# Patient Record
Sex: Female | Born: 1965 | Race: Black or African American | Hispanic: No | Marital: Married | Smoking: Never smoker
Health system: Southern US, Community
[De-identification: ages and names within clinical notes are randomized; demographics above are authoritative.]

---

## 2016-09-13 ENCOUNTER — Other Ambulatory Visit: Payer: Self-pay | Admitting: Internal Medicine

## 2016-09-13 DIAGNOSIS — Z1231 Encounter for screening mammogram for malignant neoplasm of breast: Secondary | ICD-10-CM

## 2016-09-15 ENCOUNTER — Ambulatory Visit
Admission: RE | Admit: 2016-09-15 | Discharge: 2016-09-15 | Disposition: A | Discharge status: Home / Self Care | Payer: BC Managed Care – PPO | Source: Ambulatory Visit | Attending: Internal Medicine | Admitting: Internal Medicine

## 2016-09-15 DIAGNOSIS — Z1231 Encounter for screening mammogram for malignant neoplasm of breast: Secondary | ICD-10-CM | POA: Diagnosis not present

## 2016-09-15 DIAGNOSIS — R928 Other abnormal and inconclusive findings on diagnostic imaging of breast: Secondary | ICD-10-CM | POA: Insufficient documentation

## 2016-09-19 ENCOUNTER — Other Ambulatory Visit: Payer: Self-pay | Admitting: Internal Medicine

## 2016-09-19 DIAGNOSIS — N6320 Unspecified lump in the left breast, unspecified quadrant: Secondary | ICD-10-CM

## 2016-09-19 DIAGNOSIS — N6310 Unspecified lump in the right breast, unspecified quadrant: Secondary | ICD-10-CM

## 2016-09-19 DIAGNOSIS — R928 Other abnormal and inconclusive findings on diagnostic imaging of breast: Secondary | ICD-10-CM

## 2016-09-29 ENCOUNTER — Ambulatory Visit
Admission: RE | Admit: 2016-09-29 | Discharge: 2016-09-29 | Disposition: A | Discharge status: Home / Self Care | Payer: BC Managed Care – PPO | Source: Ambulatory Visit | Attending: Internal Medicine | Admitting: Internal Medicine

## 2016-09-29 DIAGNOSIS — N6310 Unspecified lump in the right breast, unspecified quadrant: Secondary | ICD-10-CM

## 2016-09-29 DIAGNOSIS — N6001 Solitary cyst of right breast: Secondary | ICD-10-CM | POA: Diagnosis not present

## 2016-09-29 DIAGNOSIS — R928 Other abnormal and inconclusive findings on diagnostic imaging of breast: Secondary | ICD-10-CM

## 2016-09-29 DIAGNOSIS — N6320 Unspecified lump in the left breast, unspecified quadrant: Secondary | ICD-10-CM | POA: Diagnosis not present

## 2016-10-22 ENCOUNTER — Encounter (HOSPITAL_COMMUNITY): Payer: Self-pay | Admitting: Emergency Medicine

## 2016-10-22 ENCOUNTER — Emergency Department (HOSPITAL_COMMUNITY)
Admission: EM | Admit: 2016-10-22 | Discharge: 2016-10-22 | Disposition: A | Discharge status: Home / Self Care | Payer: BC Managed Care – PPO | Attending: Emergency Medicine | Admitting: Emergency Medicine

## 2016-10-22 DIAGNOSIS — L539 Erythematous condition, unspecified: Secondary | ICD-10-CM | POA: Diagnosis present

## 2016-10-22 DIAGNOSIS — T63441A Toxic effect of venom of bees, accidental (unintentional), initial encounter: Secondary | ICD-10-CM

## 2016-10-22 NOTE — Discharge Instructions (Signed)
Contact a health care provider if: °Your symptoms do not get better in 2-3 days. °You have redness, swelling, or pain that spreads beyond the area of the sting. °You have a fever. °Get help right away if: °You have symptoms of a severe allergic reaction. These include: °Wheezing or difficulty breathing. °Tightness in the chest or chest pain. °Light-headedness or fainting. °Itchy, raised, red patches on the skin. °Nausea or vomiting. °Abdominal cramping. °Diarrhea. °A swollen tongue or lips, or trouble swallowing. °Dizziness or fainting. °

## 2016-10-22 NOTE — ED Triage Notes (Signed)
Pt. Stated, I got stung and I think the stinger is still there. It burns.  No allergic except localized on the left side of neck.

## 2016-10-22 NOTE — ED Notes (Signed)
Declined W/C at D/C and was escorted to lobby by RN. 

## 2016-10-22 NOTE — ED Provider Notes (Signed)
[facility] Provider Note   CSN: [id number] Arrival date & time: 10/22/16  1314     History   Chief Complaint Chief Complaint  Patient presents with  . Insect Bite    HPI Tara Harvey is a 51 y.o. female who presents emergency Department with chief complaint of B stating to her neck. It occurred just prior to arrival. She's been waiting for about 2 and half hours. She denies any lip or tongue swelling, difficulty swallowing, itching in her throat, wheezing, coughing, nausea, vomiting. She has no hives or rash. She has applied ice to the area. She states that it burns but feels better than it did initially.  HPI  History reviewed. No pertinent past medical history.  There are no active problems to display for this patient.   History reviewed. No pertinent surgical history.  OB History    No data available       Home Medications    Prior to Admission medications   Not on File    Family History Family History  Problem Relation Age of Onset  . Breast cancer Neg Hx     Social History Social History  Substance Use Topics  . Smoking status: Never Smoker  . Smokeless tobacco: Never Used  . Alcohol use No     Allergies   Patient has no allergy information on record.   Review of Systems Review of Systems  HENT: Negative for trouble swallowing and voice change.   Respiratory: Negative for wheezing.   Gastrointestinal: Negative for nausea and vomiting.  Skin: Negative for rash.     Physical Exam Updated Vital Signs BP 122/60 (BP Location: Right Arm)   Pulse 82   Temp 98.3 F (36.8 C) (Oral)   Resp 17   Ht  (1.676 m)   Wt 97.5 kg (215 lb)   SpO2 99%   BMI 34.70 kg/m   Physical Exam  Constitutional: She is oriented to person, place, and time. She appears well-developed and well-nourished. No distress.  HENT:  Head: Normocephalic and atraumatic.  Eyes: Conjunctivae are normal. No scleral icterus.  Neck: Normal range of motion.    Cardiovascular: Normal rate, regular rhythm and normal heart sounds.  Exam reveals no gallop and no friction rub.   No murmur heard. Pulmonary/Chest: Effort normal and breath sounds normal. No respiratory distress. She has no wheezes.  Abdominal: Soft. Bowel sounds are normal. She exhibits no distension and no mass. There is no tenderness. There is no guarding.  Neurological: She is alert and oriented to person, place, and time.  Skin: Skin is warm and dry. She is not diaphoretic.  Erythema on the left side of the neck. No signs of  infection  Psychiatric: Her behavior is normal.  Nursing note and vitals reviewed.    ED Treatments / Results  Labs (all labs ordered are listed, but only abnormal results are displayed) Labs Reviewed - No data to display  EKG  EKG Interpretation None       Radiology No results found.  Procedures Procedures (including critical care time)  Medications Ordered in ED Medications - No data to display   Initial Impression / Assessment and Plan / ED Course  I have reviewed the triage vital signs and the nursing notes.  Pertinent labs & imaging results that were available during my care of the patient were reviewed by me and considered in my medical decision making (see chart for details).    Patient with bee sting, no  signs of allergic reaction or anaphylaxis. Discussed supportive care at home. She presents appropriate for discharge at this time. Discussed return precautions.  Final Clinical Impressions(s) / ED Diagnoses   Final diagnoses:  Bee sting, accidental or unintentional, initial encounter    New Prescriptions New Prescriptions   No medications on file     Arthor Captain, PA-C 10/22/16 1622    Charlynne Pander, MD 10/22/16 [phone]

## 2017-11-09 IMAGING — US US BREAST*L* LIMITED INC AXILLA
1 series · 12 of 12 positions shown · non-contrast
Comparison: Previous exam(s).

CLINICAL DATA: Callback from baseline screening mammogram for
possible bilateral masses

EXAM:
2D DIGITAL DIAGNOSTIC BILATERAL MAMMOGRAM WITH CAD AND ADJUNCT TOMO
ULTRASOUND BILATERAL BREAST

[Series 1: us breast*left* limited inc axilla · 0.06mm/px · 12 of 12 slices shown]
[im 1/12]
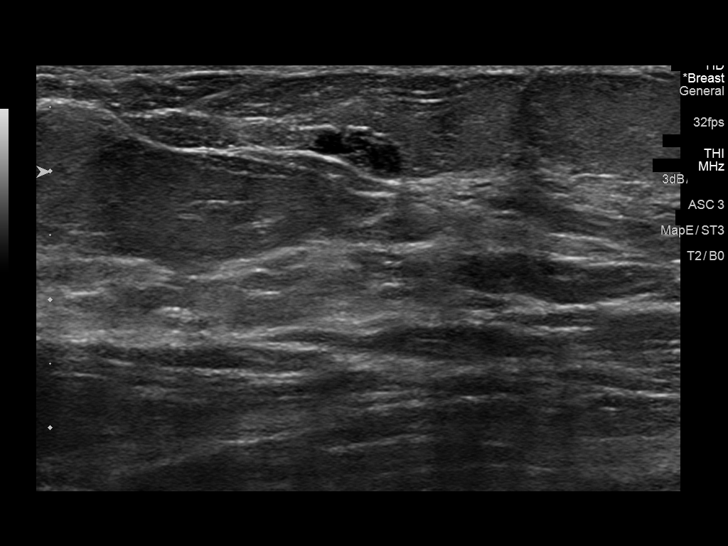
[im 2/12]
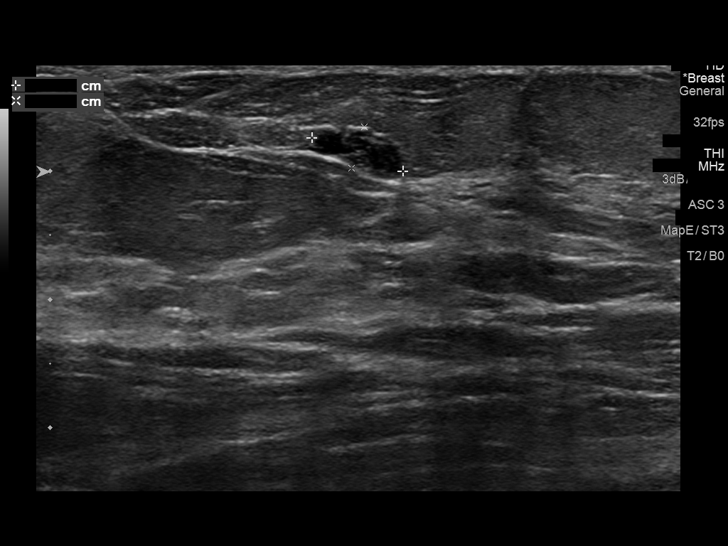
[im 3/12]
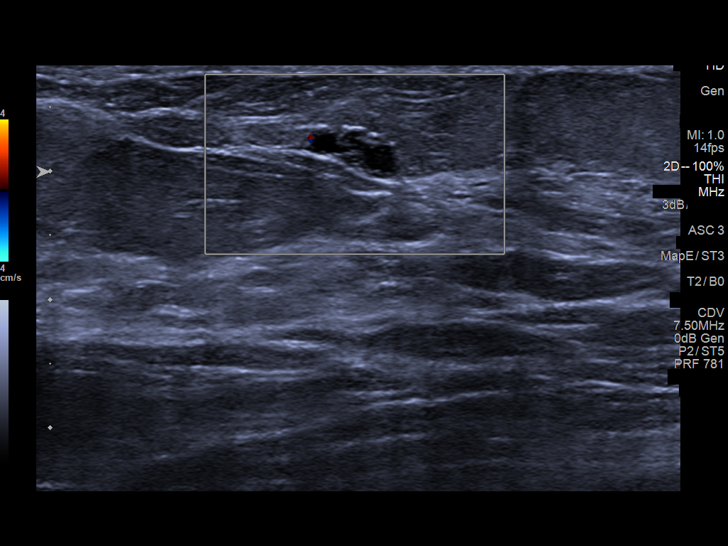
[im 4/12]
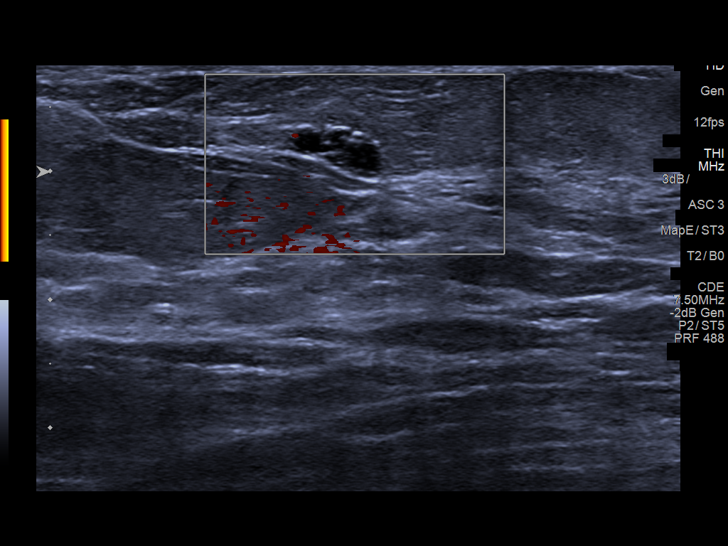
[im 5/12]
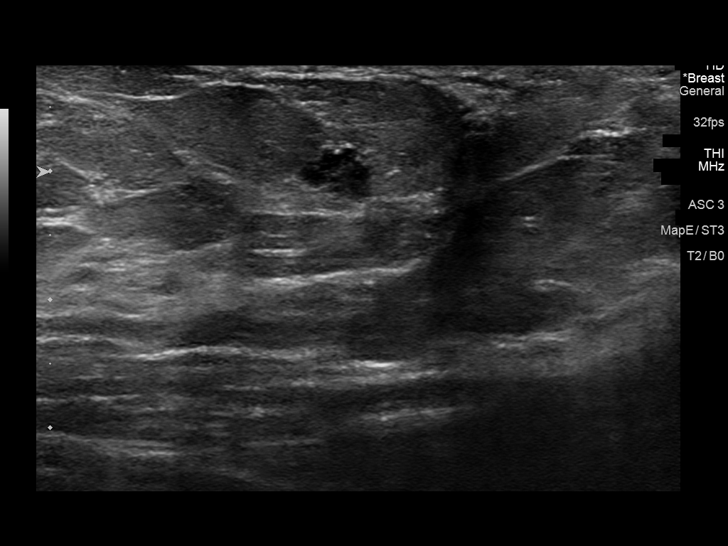
[im 6/12]
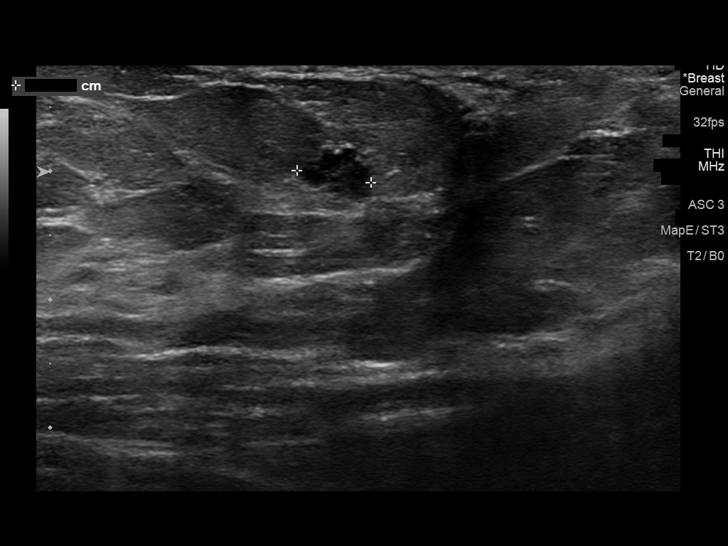
[im 7/12]
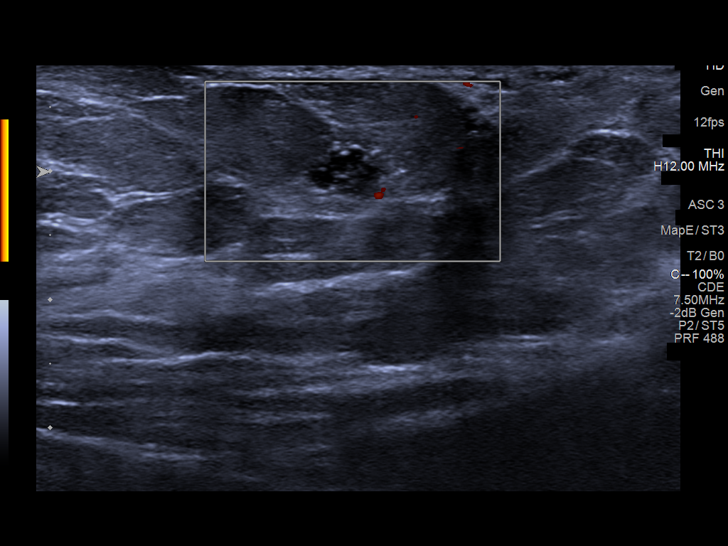
[im 8/12]
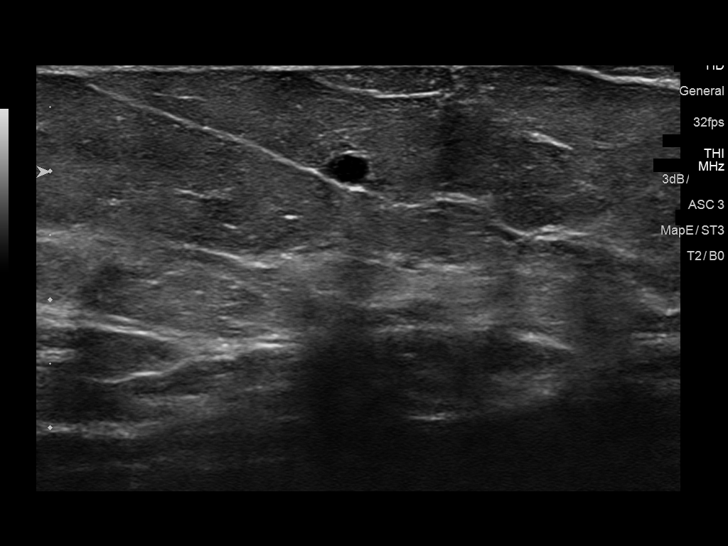
[im 9/12]
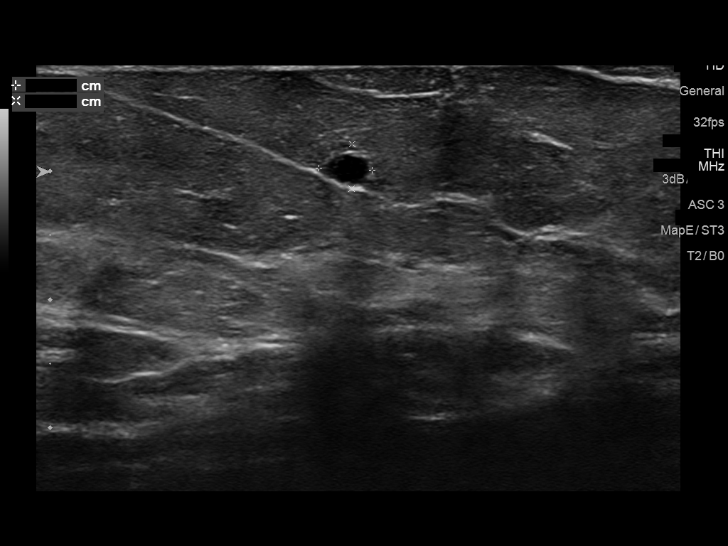
[im 10/12]
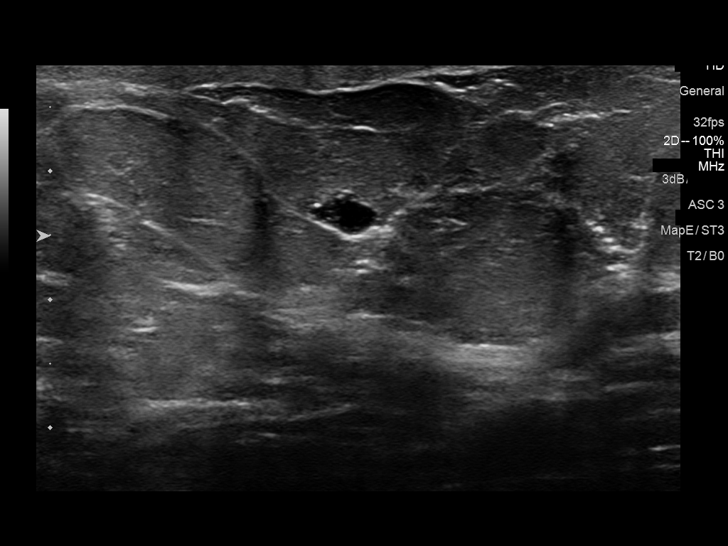
[im 11/12]
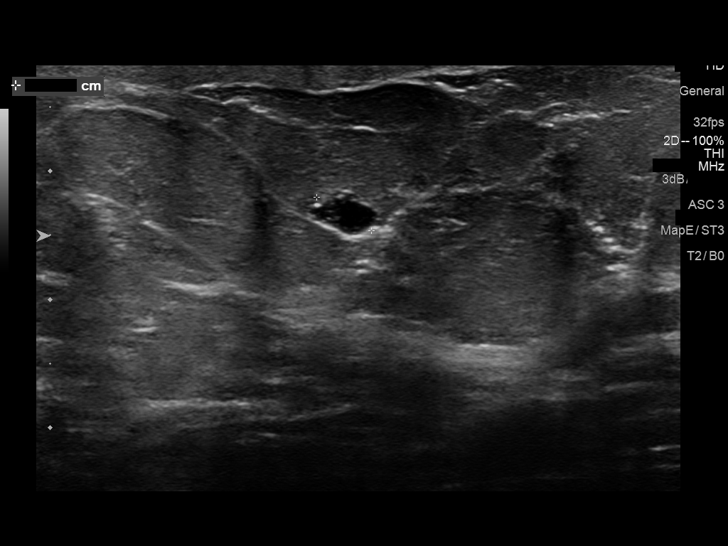
[im 12/12]
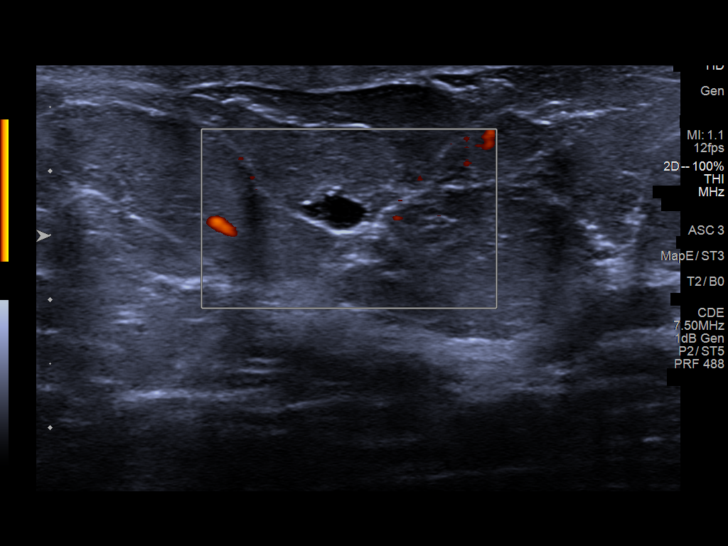

[12 of 12 positions shown; findings below may reference images not displayed]

ACR Breast Density Category c: The breast tissue is heterogeneously
dense, which may obscure small masses.
FINDINGS: There are multiple bilateral oval, circumscribed masses. Additional
views of the right breast demonstrate a persistent 6 mm oval,
circumscribed mass within the slightly upper, inner right breast,
anterior depth.

Within the lower, inner left breast, anterior depth, there is a 5 mm
oval, circumscribed mass. There is an additional oval, circumscribed
mass within the slightly upper left breast, anterior depth, also
measuring approximately 5 mm. The possible mass seen within the
slightly medial left breast, middle depth on the CC view only of the
screening mammogram does not persist on the additional views. Within
the slightly lateral, left breast, posterior depth, there is a
persistent oval, low-density mass with obscured margins.

Mammographic images were processed with CAD.

Targeted ultrasound of the right breast demonstrates an oval,
circumscribed, hypoechoic mass at 12 o'clock, 1 cm from the nipple
measuring 8 x 3 x 6 mm, thought to likely represent a complicated
cyst, and to correspond to the mass seen within the slightly
superior left breast mammographically. There is a similar-appearing
oval, circumscribed, hypoechoic mass at 6 o'clock, 1 cm from the
nipple measuring 4 x 4 x 5 mm, also thought to likely represent a
complicated cyst and to correspond to the mass seen within the
inferior left breast, anterior depth. No definite sonographic
correlate is identified for the oval mass with obscured margins seen
in the slightly lateral left breast, posterior depth.

Targeted ultrasound of the right breast demonstrates an oval,
anechoic, circumscribed mass at [DATE], 2 cm from the nipple
measuring 5 x 5 x 4 mm. No internal vascularity is identified.
Findings are consistent with a simple cyst and correspond to the
mass seen mammographically within the upper inner right breast,
anterior depth.
IMPRESSION: 1. Probably benign mass within the slightly lateral left breast,
posterior depth, only seen mammographically.
2. Probable complicated cysts at 12 o'clock, 1 cm from the nipple
and 6 o'clock, 1 cm from the nipple.
3. Right breast simple cyst.

RECOMMENDATION:
Left diagnostic mammogram and possible ultrasound in 6 months.

I have discussed the findings and recommendations with the patient.
Results were also provided in writing at the conclusion of the
visit. If applicable, a reminder letter will be sent to the patient
regarding the next appointment.

BI-RADS CATEGORY  3: Probably benign.

## 2018-06-08 ENCOUNTER — Other Ambulatory Visit: Payer: Self-pay | Admitting: Internal Medicine

## 2018-06-08 DIAGNOSIS — R928 Other abnormal and inconclusive findings on diagnostic imaging of breast: Secondary | ICD-10-CM

## 2018-09-11 IMAGING — US US BREAST*R* LIMITED INC AXILLA
2 series · 6 of 6 positions shown · non-contrast
Comparison: Previous exam(s).

CLINICAL DATA: Callback from baseline screening mammogram for
possible bilateral masses

EXAM:
2D DIGITAL DIAGNOSTIC BILATERAL MAMMOGRAM WITH CAD AND ADJUNCT TOMO
ULTRASOUND BILATERAL BREAST

[Series 1: us breast*right* limited inc axilla · 0.06mm/px · 3 of 3 slices shown (1 of 2)]
[im 1/3]
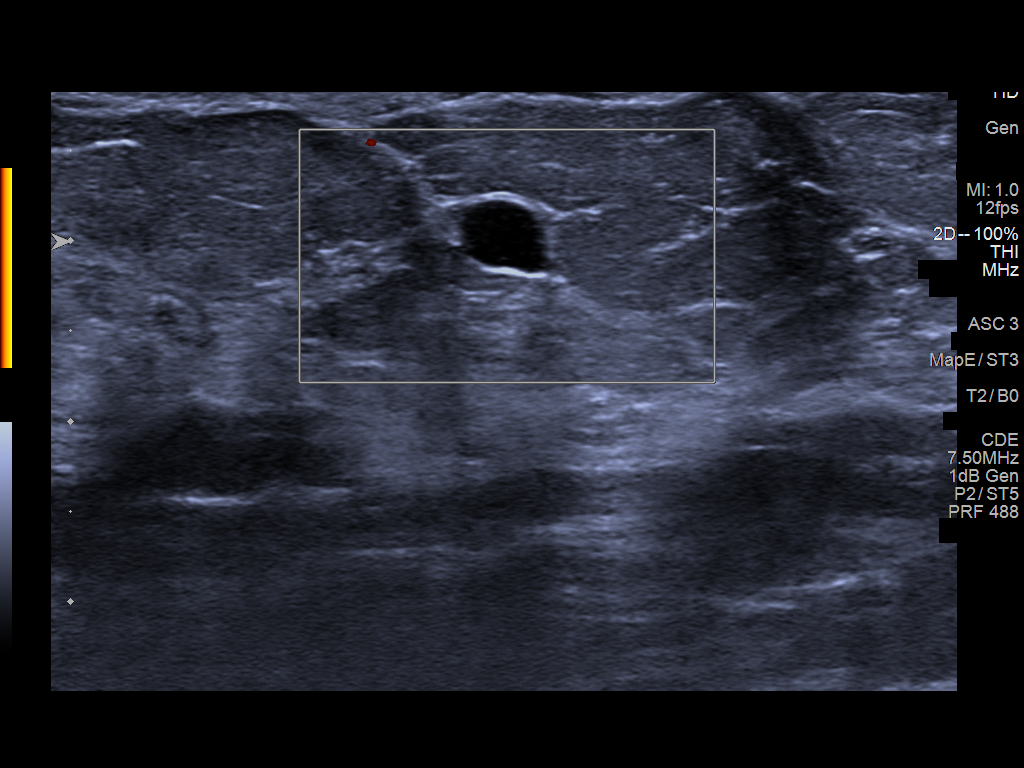
[im 2/3]
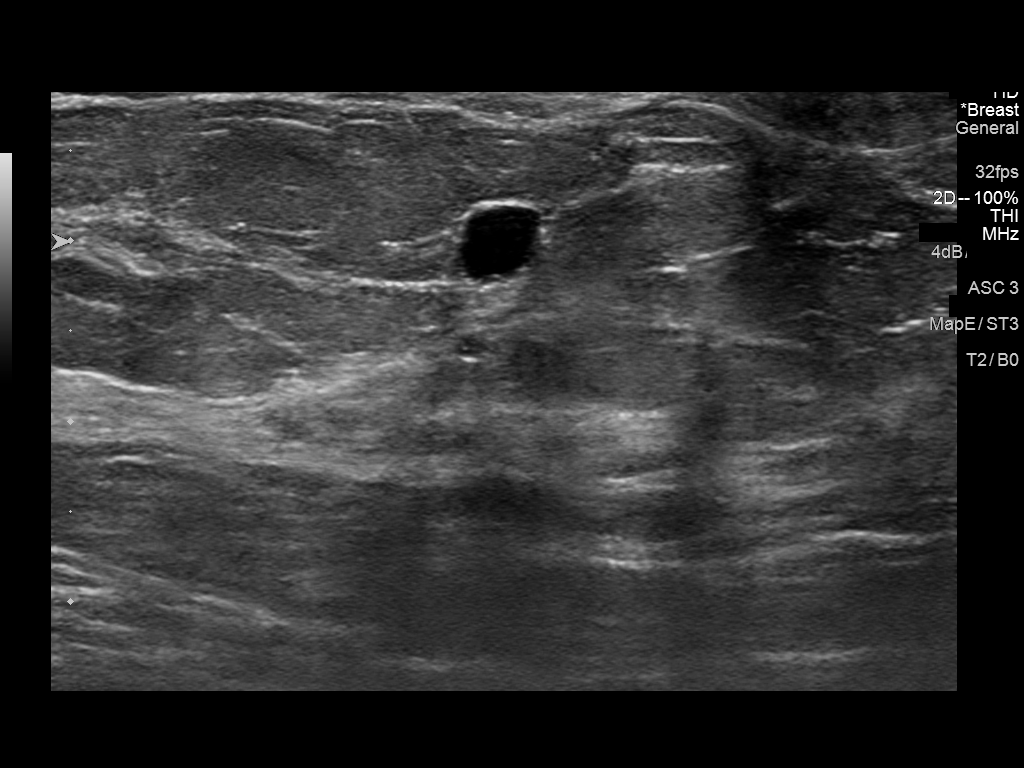
[im 3/3]
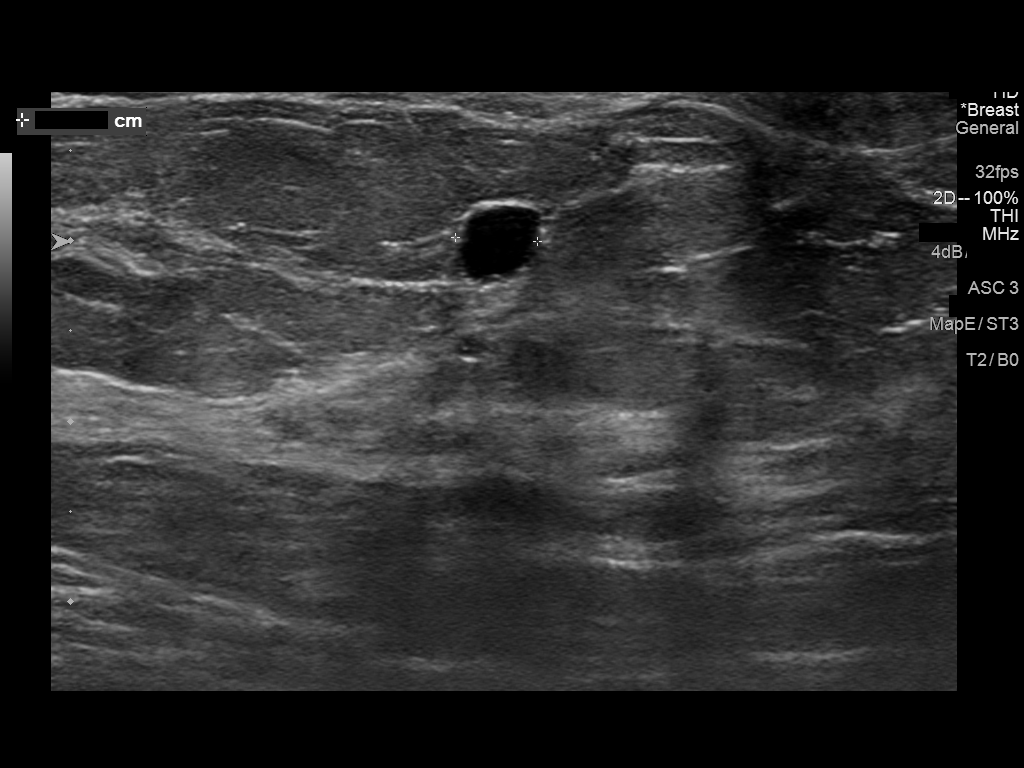

[Series 2: us breast*right* limited inc axilla · 0.06mm/px · 3 of 3 slices shown (2 of 2)]
[im 1/3]
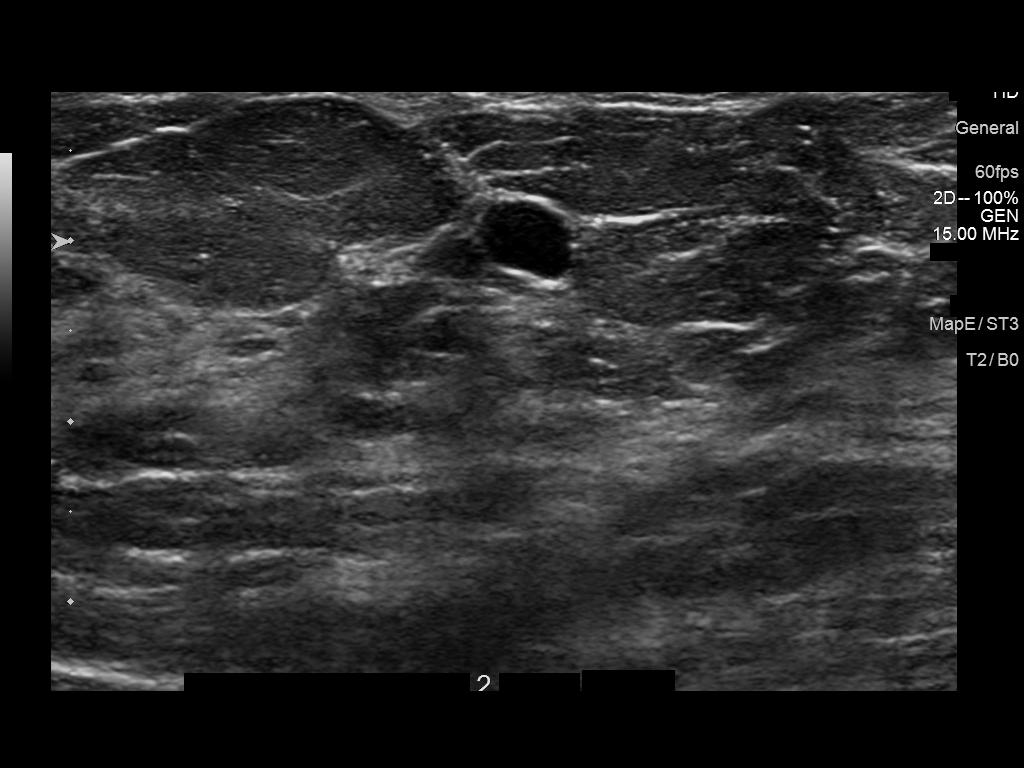
[im 2/3]
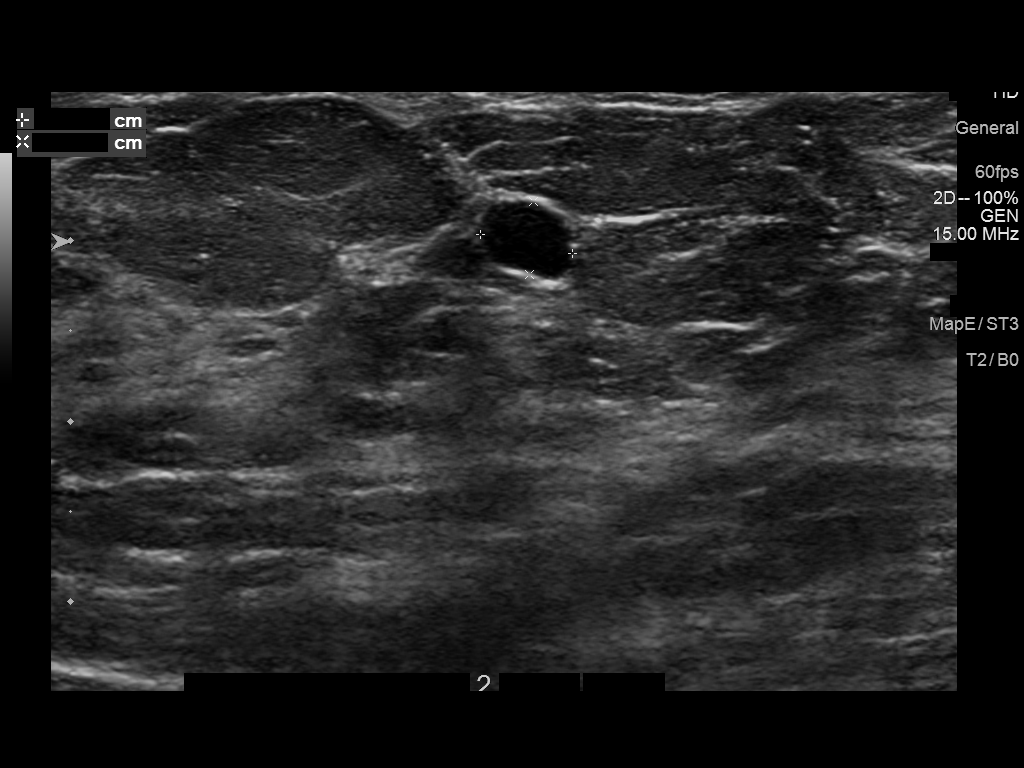
[im 3/3]
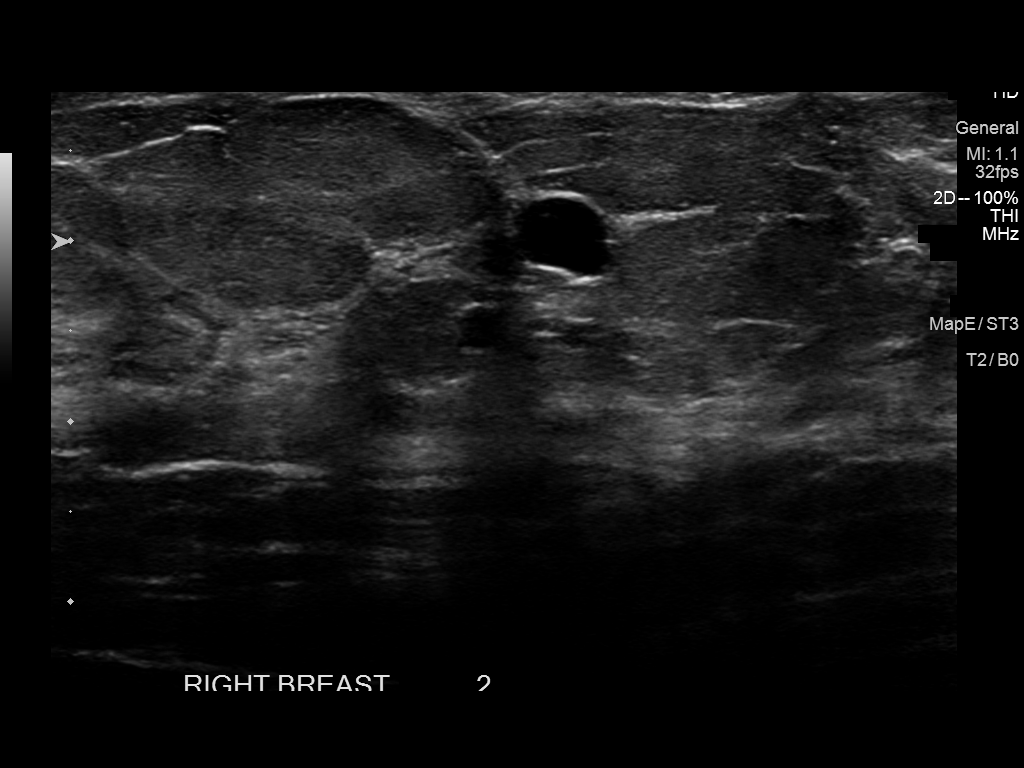

[6 of 6 positions shown; findings below may reference images not displayed]

ACR Breast Density Category c: The breast tissue is heterogeneously
dense, which may obscure small masses.
FINDINGS: There are multiple bilateral oval, circumscribed masses. Additional
views of the right breast demonstrate a persistent 6 mm oval,
circumscribed mass within the slightly upper, inner right breast,
anterior depth.

Within the lower, inner left breast, anterior depth, there is a 5 mm
oval, circumscribed mass. There is an additional oval, circumscribed
mass within the slightly upper left breast, anterior depth, also
measuring approximately 5 mm. The possible mass seen within the
slightly medial left breast, middle depth on the CC view only of the
screening mammogram does not persist on the additional views. Within
the slightly lateral, left breast, posterior depth, there is a
persistent oval, low-density mass with obscured margins.

Mammographic images were processed with CAD.

Targeted ultrasound of the right breast demonstrates an oval,
circumscribed, hypoechoic mass at 12 o'clock, 1 cm from the nipple
measuring 8 x 3 x 6 mm, thought to likely represent a complicated
cyst, and to correspond to the mass seen within the slightly
superior left breast mammographically. There is a similar-appearing
oval, circumscribed, hypoechoic mass at 6 o'clock, 1 cm from the
nipple measuring 4 x 4 x 5 mm, also thought to likely represent a
complicated cyst and to correspond to the mass seen within the
inferior left breast, anterior depth. No definite sonographic
correlate is identified for the oval mass with obscured margins seen
in the slightly lateral left breast, posterior depth.

Targeted ultrasound of the right breast demonstrates an oval,
anechoic, circumscribed mass at [DATE], 2 cm from the nipple
measuring 5 x 5 x 4 mm. No internal vascularity is identified.
Findings are consistent with a simple cyst and correspond to the
mass seen mammographically within the upper inner right breast,
anterior depth.
IMPRESSION: 1. Probably benign mass within the slightly lateral left breast,
posterior depth, only seen mammographically.
2. Probable complicated cysts at 12 o'clock, 1 cm from the nipple
and 6 o'clock, 1 cm from the nipple.
3. Right breast simple cyst.

RECOMMENDATION:
Left diagnostic mammogram and possible ultrasound in 6 months.

I have discussed the findings and recommendations with the patient.
Results were also provided in writing at the conclusion of the
visit. If applicable, a reminder letter will be sent to the patient
regarding the next appointment.

BI-RADS CATEGORY  3: Probably benign.

## 2020-09-03 ENCOUNTER — Encounter (HOSPITAL_COMMUNITY): Payer: Self-pay

## 2020-09-03 ENCOUNTER — Other Ambulatory Visit: Payer: Self-pay

## 2020-09-03 ENCOUNTER — Ambulatory Visit (HOSPITAL_COMMUNITY)
Admission: EM | Admit: 2020-09-03 | Discharge: 2020-09-03 | Disposition: A | Discharge status: Home / Self Care | Payer: BC Managed Care – PPO | Attending: Emergency Medicine | Admitting: Emergency Medicine

## 2020-09-03 DIAGNOSIS — M545 Low back pain, unspecified: Secondary | ICD-10-CM | POA: Diagnosis present

## 2020-09-03 DIAGNOSIS — R0981 Nasal congestion: Secondary | ICD-10-CM

## 2020-09-03 DIAGNOSIS — Z20822 Contact with and (suspected) exposure to covid-19: Secondary | ICD-10-CM | POA: Diagnosis not present

## 2020-09-03 MED ORDER — MELOXICAM 7.5 MG PO TABS: 7.5000 mg | ORAL_TABLET | Freq: Every day | ORAL | 0 refills | Status: DC

## 2020-09-03 MED ORDER — KETOROLAC TROMETHAMINE 30 MG/ML IJ SOLN
30.0000 mg | Freq: Once | INTRAMUSCULAR | Status: AC
  Administered 2020-09-03: 30 mg via INTRAMUSCULAR

## 2020-09-03 MED ORDER — CETIRIZINE HCL 10 MG PO TABS: 10.0000 mg | ORAL_TABLET | Freq: Every day | ORAL | 1 refills | Status: AC

## 2020-09-03 MED ORDER — FLUTICASONE PROPIONATE 50 MCG/ACT NA SUSP: 1.0000 | Freq: Every day | NASAL | 2 refills | Status: AC

## 2020-09-03 MED ORDER — KETOROLAC TROMETHAMINE 30 MG/ML IJ SOLN
INTRAMUSCULAR | Status: AC
  Filled 2020-09-03: qty 1

## 2020-09-03 MED ORDER — PREDNISONE 20 MG PO TABS: 40.0000 mg | ORAL_TABLET | Freq: Every day | ORAL | 0 refills | Status: DC

## 2020-09-03 NOTE — ED Provider Notes (Signed)
[facility]    CSN: [id number] Arrival date & time: 09/03/20  1635      History   Chief Complaint Chief Complaint  Patient presents with   Back Pain    HPI Tara Harvey is a 55 y.o. female.   Patient presents with left-sided lower back pain radiating down left leg for 1 week.  Worsen with long periods of standing sitting and lying.  Denies any numbness or tingling, current injury or trauma.  Range of motion intact.  Has attempted over-the-counter topical creams, vinegar with minimal relief.  Concern with nasal congestion with postnasal drip for 1 week.  Denies fever, chills, body aches, rhinorrhea, headaches, ear pain or fullness, sore throat, chest pain, shortness of breath, wheezing.  Has had episodes of nasal congestion intermittently and has been told prior that she has some form of a sinus issue.  Not currently using any medication.  No known sick contacts.  Vaccinated.  History reviewed. No pertinent past medical history.  There are no problems to display for this patient.   History reviewed. No pertinent surgical history.  OB History   No obstetric history on file.      Home Medications    Prior to Admission medications   Medication Sig Start Date End Date Taking? Authorizing Provider  cetirizine (ZYRTEC ALLERGY) 10 MG tablet Take 1 tablet (10 mg total) by mouth daily. 09/03/20  Yes Izabel Chim R, NP  fluticasone (FLONASE) 50 MCG/ACT nasal spray Place 1 spray into both nostrils daily. 09/03/20  Yes Delford Wingert R, NP  meloxicam (MOBIC) 7.5 MG tablet Take 1 tablet (7.5 mg total) by mouth daily. 09/03/20  Yes Tranice Laduke R, NP  predniSONE (DELTASONE) 20 MG tablet Take 2 tablets (40 mg total) by mouth daily. 09/03/20  Yes Dyllan Kats, Elita Boone, NP    Family History Family History  Problem Relation Age of Onset   Breast cancer Neg Hx     Social History Social History   Tobacco Use   Smoking status: Never   Smokeless tobacco: Never   Substance Use Topics   Alcohol use: No   Drug use: No     Allergies   Patient has no allergy information on record.   Review of Systems Review of Systems Deferred HPI   Physical Exam Triage Vital Signs ED Triage Vitals  Enc Vitals Group     BP 09/03/20 1717 135/86     Pulse Rate 09/03/20 1717 70     Resp 09/03/20 1717 18     Temp 09/03/20 1717 98.8 F (37.1 C)     Temp Source 09/03/20 1717 Oral     SpO2 09/03/20 1717 100 %     Weight --      Height --      Head Circumference --      Peak Flow --      Pain Score 09/03/20 1715 8     Pain Loc --      Pain Edu? --      Excl. in GC? --    No data found.  Updated Vital Signs BP 135/86 (BP Location: Right Arm)   Pulse 70   Temp 98.8 F (37.1 C) (Oral)   Resp 18   SpO2 100%   Visual Acuity Right Eye Distance:   Left Eye Distance:   Bilateral Distance:    Right Eye Near:   Left Eye Near:    Bilateral Near:     Physical Exam Constitutional:  Appearance: Normal appearance. She is normal weight.  HENT:     Head: Normocephalic.     Right Ear: Tympanic membrane, ear canal and external ear normal.     Left Ear: Tympanic membrane, ear canal and external ear normal.     Nose: Congestion present. No rhinorrhea.     Mouth/Throat:     Mouth: Mucous membranes are moist.     Pharynx: Oropharynx is clear.  Eyes:     Extraocular Movements: Extraocular movements intact.  Pulmonary:     Effort: Pulmonary effort is normal.     Breath sounds: Normal breath sounds.  Musculoskeletal:     Cervical back: Normal.     Thoracic back: Normal.     Lumbar back: Tenderness present. No swelling, edema or bony tenderness. Normal range of motion.       Back:  Skin:    General: Skin is warm and dry.  Neurological:     Mental Status: She is alert and oriented to person, place, and time. Mental status is at baseline.  Psychiatric:        Mood and Affect: Mood normal.        Behavior: Behavior normal.     UC Treatments /  Results  Labs (all labs ordered are listed, but only abnormal results are displayed) Labs Reviewed  SARS CORONAVIRUS 2 (TAT 6-24 HRS)    EKG   Radiology No results found.  Procedures Procedures (including critical care time)  Medications Ordered in UC Medications  ketorolac (TORADOL) 30 MG/ML injection 30 mg (has no administration in time range)    Initial Impression / Assessment and Plan / UC Course  I have reviewed the triage vital signs and the nursing notes.  Pertinent labs & imaging results that were available during my care of the patient were reviewed by me and considered in my medical decision making (see chart for details).  Acute midline low back pain without sciatica Nasal congestion  1.  Flonase 50 mcg 1 spray each nare daily 2.  Zyrtec 10 mg daily before bed 3.  Prednisone 40 mg daily for 5 days 4.  Meloxicam 7.5 mg daily as needed 5.  Toradol 30 mg IM now 6.  Follow-up with orthopedic specialist for persistent or recurrent back pain 7.  COVID test pending Final Clinical Impressions(s) / UC Diagnoses   Final diagnoses:  Acute midline low back pain without sciatica  Nasal congestion     Discharge Instructions      Starting tomorrow evening take prednisone, at the completion of prednisone you may take meloxicam as needed, while using these medications do not use ibuprofen or Aleve, can use over-the-counter Tylenol for additional comfort  Can use heating pad in 15-minute intervals for additional comfort  For persistent pain please follow-up with orthopedic specialist of your choosing for more long-term management and further evaluation  Your COVID test is pending 24 hours, you will be called if positive, if positive please follow CDC guidelines for quarantine  You can start Zyrtec tomorrow taking the pill daily before bed  You can use Flonase every morning, and the nozzle to outwards before spraying it so that it helps to go down your sinus  passages   ED Prescriptions     Medication Sig Dispense Auth. Provider   cetirizine (ZYRTEC ALLERGY) 10 MG tablet Take 1 tablet (10 mg total) by mouth daily. 30 tablet Woodrow Drab R, NP   fluticasone (FLONASE) 50 MCG/ACT nasal spray Place 1 spray into both  nostrils daily. 11.1 mL Pranav Lince R, NP   predniSONE (DELTASONE) 20 MG tablet Take 2 tablets (40 mg total) by mouth daily. 10 tablet Shiela Bruns, Hansel Starling R, NP   meloxicam (MOBIC) 7.5 MG tablet Take 1 tablet (7.5 mg total) by mouth daily. 30 tablet Valinda Hoar, NP      PDMP not reviewed this encounter.   Valinda Hoar, NP 09/03/20 Rickey Primus

## 2020-09-03 NOTE — ED Triage Notes (Signed)
Pt c/o pain from back that radiates to left side and left leg and congestion. Started: a week ago

## 2020-09-03 NOTE — Discharge Instructions (Addendum)
Starting tomorrow evening take prednisone, at the completion of prednisone you may take meloxicam as needed, while using these medications do not use ibuprofen or Aleve, can use over-the-counter Tylenol for additional comfort  Can use heating pad in 15-minute intervals for additional comfort  For persistent pain please follow-up with orthopedic specialist of your choosing for more long-term management and further evaluation  Your COVID test is pending 24 hours, you will be called if positive, if positive please follow CDC guidelines for quarantine  You can start Zyrtec tomorrow taking the pill daily before bed  You can use Flonase every morning, and the nozzle to outwards before spraying it so that it helps to go down your sinus passages

## 2020-09-04 LAB — SARS CORONAVIRUS 2 (TAT 6-24 HRS): SARS Coronavirus 2: NEGATIVE

## 2021-02-12 ENCOUNTER — Emergency Department (HOSPITAL_COMMUNITY)
Admission: EM | Admit: 2021-02-12 | Discharge: 2021-02-12 | Disposition: A | Discharge status: Home / Self Care | Payer: BC Managed Care – PPO | Attending: Emergency Medicine | Admitting: Emergency Medicine

## 2021-02-12 ENCOUNTER — Encounter (HOSPITAL_COMMUNITY): Payer: Self-pay | Admitting: Emergency Medicine

## 2021-02-12 DIAGNOSIS — R059 Cough, unspecified: Secondary | ICD-10-CM | POA: Diagnosis present

## 2021-02-12 DIAGNOSIS — J069 Acute upper respiratory infection, unspecified: Secondary | ICD-10-CM | POA: Diagnosis not present

## 2021-02-12 DIAGNOSIS — Z20822 Contact with and (suspected) exposure to covid-19: Secondary | ICD-10-CM | POA: Diagnosis not present

## 2021-02-12 LAB — RESP PANEL BY RT-PCR (FLU A&B, COVID) ARPGX2
Influenza A by PCR: NEGATIVE
Influenza B by PCR: NEGATIVE
SARS Coronavirus 2 by RT PCR: NEGATIVE

## 2021-02-12 MED ORDER — ACETAMINOPHEN 325 MG PO TABS
650.0000 mg | ORAL_TABLET | Freq: Once | ORAL | Status: AC
  Administered 2021-02-12: 650 mg via ORAL
  Filled 2021-02-12: qty 2

## 2021-02-12 MED ORDER — GUAIFENESIN 100 MG/5ML PO LIQD
5.0000 mL | Freq: Once | ORAL | Status: AC
  Administered 2021-02-12: 5 mL via ORAL
  Filled 2021-02-12: qty 5

## 2021-02-12 NOTE — ED Provider Triage Note (Signed)
Emergency Medicine Provider Triage Evaluation Note  Tara Harvey , a 56 y.o. female  was evaluated in triage.  Pt complains of congestion, rhinorrhea, cough x1 week.  Patient is complaining of facial pain, worse on the left side, as well as right ear pain.  She has a sore throat exacerbated by cough.  She explains having some chest pain with coughing, none at baseline. She was trying OTC medications with minimal relief.   Review of Systems  Positive: Nasal congestion, rhinorrhea, dry cough Negative: Chest pain, shortness of breath, fever, chills  Physical Exam  BP 139/64 (BP Location: Left Arm)    Pulse 99    Temp 98.3 F (36.8 C) (Oral)    Resp 18    SpO2 99%  Gen:   Awake, no distress   Resp:  Normal effort  MSK:   Moves extremities without difficulty  Other:    Medical Decision Making  Medically screening exam initiated at 11:03 AM.  Appropriate orders placed.  [name] was informed that the remainder of the evaluation will be completed by another provider, this initial triage assessment does not replace that evaluation, and the importance of remaining in the ED until their evaluation is complete.     [name] T, PA-C 02/12/21 1103

## 2021-02-12 NOTE — ED Triage Notes (Signed)
Patient here with complaint of rhinorrhea and cough that started one week ago. Patient complains of pain with nasal congestion and sore throat that is exacerbated by cough. Patient alert, oriented, speaking in complete sentences, and is in no apparent distress at this time.

## 2021-02-12 NOTE — ED Provider Notes (Signed)
[facility] EMERGENCY DEPARTMENT Provider Note   CSN: [id number] Arrival date & time: 02/12/21  1056     History  Chief Complaint  Patient presents with   Cough    Tara Harvey is a 56 y.o. female.   Cough Cough characteristics:  Dry Sputum characteristics: none. Severity:  Mild Onset quality:  Gradual Duration:  4 days Timing:  Intermittent Progression:  Unchanged Chronicity:  New Smoker: no   Context: sick contacts (grandchild)   Relieved by:  Nothing Worsened by:  Nothing Ineffective treatments:  None tried Associated symptoms: chills, myalgias, sinus congestion and sore throat   Associated symptoms: no chest pain, no fever, no headaches, no rash and no shortness of breath   Associated symptoms comment:  Myalgias  56 year old female presents to emergency department for evaluation of URI-like symptoms.  Symptoms been ongoing for the past 3 to 4 days.  Has an contacts in the form of her grandchild.  Symptoms include sore throat, cough, myalgias, and chills.  Reports no fevers shortness of breath or chest pain.  Has not had vomiting, diarrhea, or dysuria.  He has not taken therapies at this time.  Still tolerating p.o. intake.    Home Medications Prior to Admission medications   Medication Sig Start Date End Date Taking? Authorizing Provider  cetirizine (ZYRTEC ALLERGY) 10 MG tablet Take 1 tablet (10 mg total) by mouth daily. 09/03/20   White, Elita Boone, NP  fluticasone (FLONASE) 50 MCG/ACT nasal spray Place 1 spray into both nostrils daily. 09/03/20   White, Elita Boone, NP  meloxicam (MOBIC) 7.5 MG tablet Take 1 tablet (7.5 mg total) by mouth daily. 09/03/20   White, Elita Boone, NP  predniSONE (DELTASONE) 20 MG tablet Take 2 tablets (40 mg total) by mouth daily. 09/03/20   Valinda Hoar, NP      Allergies    Patient has no known allergies.    Review of Systems   Review of Systems  Constitutional:  Positive for chills. Negative for fever.   HENT:  Positive for sore throat.   Respiratory:  Positive for cough. Negative for shortness of breath.   Cardiovascular:  Negative for chest pain.  Musculoskeletal:  Positive for myalgias.  Skin:  Negative for rash.  Neurological:  Negative for headaches.  All other systems reviewed and are negative.  Physical Exam Updated Vital Signs BP 128/84 (BP Location: Left Arm)    Pulse 80    Temp 98.4 F (36.9 C) (Oral)    Resp 17    SpO2 99%  Physical Exam Vitals and nursing note reviewed.  Constitutional:      General: She is not in acute distress.    Appearance: She is well-developed. She is not ill-appearing or diaphoretic.  HENT:     Head: Normocephalic and atraumatic.     Right Ear: External ear normal.     Left Ear: External ear normal.     Nose: Congestion present.     Mouth/Throat:     Mouth: Mucous membranes are moist.     Pharynx: Oropharyngeal exudate present.     Comments: Soft tissues soft  Eyes:     Conjunctiva/sclera: Conjunctivae normal.  Cardiovascular:     Rate and Rhythm: Normal rate and regular rhythm.     Heart sounds: No murmur heard. Pulmonary:     Effort: Pulmonary effort is normal. No respiratory distress.     Breath sounds: Normal breath sounds.  Abdominal:     Palpations: Abdomen is  soft.     Tenderness: There is no abdominal tenderness.  Musculoskeletal:        General: No swelling.     Cervical back: Neck supple.     Right lower leg: No edema.     Left lower leg: No edema.  Skin:    General: Skin is warm and dry.     Capillary Refill: Capillary refill takes less than 2 seconds.     Findings: No rash.  Neurological:     General: No focal deficit present.     Mental Status: She is alert and oriented to person, place, and time. Mental status is at baseline.  Psychiatric:        Mood and Affect: Mood normal.    ED Results / Procedures / Treatments   Labs (all labs ordered are listed, but only abnormal results are displayed) Labs Reviewed   RESP PANEL BY RT-PCR (FLU A&B, COVID) ARPGX2    EKG None  Radiology No results found.  Procedures Procedures    Medications Ordered in ED Medications  acetaminophen (TYLENOL) tablet 650 mg (650 mg Oral Given 02/12/21 1631)  guaiFENesin (ROBITUSSIN) 100 MG/5ML liquid 5 mL (5 mLs Oral Given 02/12/21 1630)    ED Course/ Medical Decision Making/ A&P                           Medical Decision Making Risk OTC drugs.   This is a 56 year old female presents to emergency department for evaluation of URI symptoms.  Differential diagnosis includes resolving to the following: COVID, flu, and pneumonia, strep pharyngitis, other viral URI.  Suspect that the patient is suffering from viral URI given that there has been sick contacts at home with similar symptoms and the patient has a constitution of symptoms consisting of myalgias, sore throat, cough.  I have low suspicion that the patient is suffering from pneumonia at this time given no focal lung sounds, no fevers, and nonproductive cough.  Do not think the patient has COPD or asthma based off my physical exam.  The patient does not have evidence of peritonsillar abscess on my oropharynx exam.  Will not swab for strep throat based on the [facility] criteria.  Discussed therapies with the patient at length and recommended OTC decongestants and Tylenol/ibuprofen.  These were provided to the patient in the emergency department and the patient was discharged home self-care with instructions to follow-up with her primary care physician.  Strict return precautions were provided.   Final Clinical Impression(s) / ED Diagnoses Final diagnoses:  Viral URI with cough    Rx / DC Orders ED Discharge Orders     None         Camila Li, MD 02/12/21 2122    Pricilla Loveless, MD 02/15/21 [phone]

## 2021-02-12 NOTE — ED Notes (Signed)
Patient verbalizes understanding of discharge instructions. Opportunity for questioning and answers were provided. Armband removed by staff, pt discharged from ED ambulatory.   

## 2021-02-12 NOTE — Discharge Instructions (Addendum)
Seen and evaluated in our emergency department for URI-like symptoms.  Your COVID and flu was negative.  I suspect that you have acquired symptoms from your grandchild who is also sick.  Recommend over-the-counter decongestants and Tylenol/ibuprofen.

## 2021-06-08 ENCOUNTER — Other Ambulatory Visit: Payer: Self-pay

## 2021-06-08 ENCOUNTER — Emergency Department
Admission: EM | Admit: 2021-06-08 | Discharge: 2021-06-08 | Disposition: A | Discharge status: Home / Self Care | Payer: BC Managed Care – PPO | Attending: Emergency Medicine | Admitting: Emergency Medicine

## 2021-06-08 DIAGNOSIS — J020 Streptococcal pharyngitis: Secondary | ICD-10-CM | POA: Diagnosis not present

## 2021-06-08 DIAGNOSIS — J029 Acute pharyngitis, unspecified: Secondary | ICD-10-CM | POA: Diagnosis present

## 2021-06-08 DIAGNOSIS — Z20822 Contact with and (suspected) exposure to covid-19: Secondary | ICD-10-CM | POA: Diagnosis not present

## 2021-06-08 LAB — GROUP A STREP BY PCR: Group A Strep by PCR: DETECTED — AB

## 2021-06-08 LAB — RESP PANEL BY RT-PCR (FLU A&B, COVID) ARPGX2
Influenza A by PCR: NEGATIVE
Influenza B by PCR: NEGATIVE
SARS Coronavirus 2 by RT PCR: NEGATIVE

## 2021-06-08 MED ORDER — DEXAMETHASONE 4 MG PO TABS
10.0000 mg | ORAL_TABLET | Freq: Once | ORAL | Status: AC
  Administered 2021-06-08: 10 mg via ORAL
  Filled 2021-06-08: qty 3

## 2021-06-08 MED ORDER — AMOXICILLIN 500 MG PO TABS: 500.0000 mg | ORAL_TABLET | Freq: Two times a day (BID) | ORAL | 0 refills | Status: AC

## 2021-06-08 NOTE — Discharge Instructions (Addendum)
-  Take Tylenol/ibuprofen as needed for pain.  You may additionally take throat lozenges as well. ?-Take all of antibiotics as prescribed. ?-Return to the emergency department anytime if you begin to experience any new or worsening symptoms. ?-Avoid contact with others for at least 5 days until your symptoms resolve. ?

## 2021-06-08 NOTE — ED Provider Notes (Signed)
? ?[facility] ?Provider Note ? ? ? Event Date/Time  ? First MD Initiated Contact with Patient 06/08/21 [phone]   ?  (approximate) ? ? ?History  ? ?Chief Complaint ?Sore Throat ? ? ?HPI ?Tara Harvey is a 56 y.o. female, no remarkable medical history, presents to the emergency department for evaluation of sore throat.  She states that she has been experiencing a sore throat with fever/chills/myalgias for the past 48 hours.  She has recently been around her husband, who has had similar symptoms over the past week.  She has been trying salt water gargle rinses with limited relief.  Denies chest pain, shortness of breath, abdominal pain, flank pain, nausea/vomiting, dysphagia, headache, dizziness/lightheadedness, or rashes/lesions. ? ?History Limitations: No limitations. ? ?    ? ? ?Physical Exam  ?Triage Vital Signs: ?ED Triage Vitals  ?Enc Vitals Group  ?   BP 06/08/21 0928 (!) 115/58  ?   Pulse Rate 06/08/21 0928 99  ?   Resp 06/08/21 0928 14  ?   Temp 06/08/21 0928 99.2 ?F (37.3 ?C)  ?   Temp Source 06/08/21 0928 Oral  ?   SpO2 06/08/21 0928 97 %  ?   Weight --   ?   Height --   ?   Head Circumference --   ?   Peak Flow --   ?   Pain Score 06/08/21 0917 3  ?   Pain Loc --   ?   Pain Edu? --   ?   Excl. in [place]? --   ? ? ?Most recent vital signs: ?Vitals:  ? 06/08/21 0928  ?BP: (!) 115/58  ?Pulse: 99  ?Resp: 14  ?Temp: 99.2 ?F (37.3 ?C)  ?SpO2: 97%  ? ? ?General: Awake, NAD.  ?Skin: Warm, dry. No rashes or lesions.  ?Eyes: PERRL. Conjunctivae normal.  ?CV: Good peripheral perfusion.  ?Resp: Normal effort.  Lung sounds are clear bilaterally in the apices and bases. ?Abd: Soft, non-tender. No distention.  ?Neuro: At baseline. No gross neurological deficits.  ? ?Focused Exam: Tonsils are exquisitely swollen bilaterally, uvula midline.  Posterior pharynx markedly erythematous.  Anterior cervical lymphadenopathy present bilaterally. ? ?Physical Exam ? ? ? ?ED Results / Procedures / Treatments   ?Labs ?(all labs ordered are listed, but only abnormal results are displayed) ?Labs Reviewed  ?GROUP A STREP BY PCR - Abnormal; Notable for the following components:  ?    Result Value  ? Group A Strep by PCR DETECTED (*)   ? All other components within normal limits  ?RESP PANEL BY RT-PCR (FLU A&B, COVID) ARPGX2  ? ? ? ?EKG ?N/A. ? ? ?RADIOLOGY ? ?ED Provider Interpretation: N/A. ? ?No results found. ? ?PROCEDURES: ? ?Critical Care performed: N/A. ? ?Procedures ? ? ? ?MEDICATIONS ORDERED IN ED: ?Medications  ?dexamethasone (DECADRON) tablet 10 mg (10 mg Oral Given 06/08/21 1100)  ? ? ? ?IMPRESSION / MDM / ASSESSMENT AND PLAN / ED COURSE  ?I reviewed the triage vital signs and the nursing notes. ?             ?               ? ?Differential diagnosis includes, but is not limited to, influenza, COVID-19, strep pharyngitis, viral URI. ? ?ED Course ?Patient appears well, vitals within normal limits for the patient.  NAD.  We will go ahead treat with oral dexamethasone 10 mg. ? ?Strep PCR positive.  Respiratory panel pending. ? ?Assessment/Plan ?Presentation  consistent with strep pharyngitis.  No evidence of peritonsillar or retropharyngeal abscess.  We will go ahead treat with oral dexamethasone and discharge with a prescription for amoxicillin.  Encouraged her to continue treating symptoms with Tylenol/ibuprofen, cough drops, and salt water gargle rinses. ? ?Provided the patient with anticipatory guidance, return precautions, and educational material. Encouraged the patient to return to the emergency department at any time if they begin to experience any new or worsening symptoms. Patient expressed understanding and agreed with the plan.  ? ?  ? ? ?FINAL CLINICAL IMPRESSION(S) / ED DIAGNOSES  ? ?Final diagnoses:  ?None  ? ? ? ?Rx / DC Orders  ? ?ED Discharge Orders   ? ?      Ordered  ?  amoxicillin (AMOXIL) 500 MG tablet  2 times daily       ? 06/08/21 1047  ? ?  ?  ? ?  ? ? ? ?Note:  This document was prepared using  Dragon voice recognition software and may include unintentional dictation errors. ?  ?Teodoro Spray, [place] ?06/08/21 1108 ? ?  ?Delman Kitten, MD ?06/08/21 1521 ? ?

## 2021-06-08 NOTE — ED Triage Notes (Signed)
Pt comes with c/o sore throat since Sunday. Pt states she did have chills and fever. ?

## 2021-10-23 ENCOUNTER — Emergency Department
Admission: EM | Admit: 2021-10-23 | Discharge: 2021-10-23 | Disposition: A | Discharge status: Home / Self Care | Payer: BC Managed Care – PPO | Attending: Emergency Medicine | Admitting: Emergency Medicine

## 2021-10-23 ENCOUNTER — Emergency Department: Payer: BC Managed Care – PPO

## 2021-10-23 ENCOUNTER — Encounter: Payer: Self-pay | Admitting: Intensive Care

## 2021-10-23 ENCOUNTER — Other Ambulatory Visit: Payer: Self-pay

## 2021-10-23 DIAGNOSIS — U071 COVID-19: Secondary | ICD-10-CM | POA: Diagnosis not present

## 2021-10-23 DIAGNOSIS — R059 Cough, unspecified: Secondary | ICD-10-CM

## 2021-10-23 DIAGNOSIS — J069 Acute upper respiratory infection, unspecified: Secondary | ICD-10-CM | POA: Insufficient documentation

## 2021-10-23 DIAGNOSIS — J329 Chronic sinusitis, unspecified: Secondary | ICD-10-CM | POA: Diagnosis not present

## 2021-10-23 LAB — RESP PANEL BY RT-PCR (FLU A&B, COVID) ARPGX2
Influenza A by PCR: NEGATIVE
Influenza B by PCR: NEGATIVE
SARS Coronavirus 2 by RT PCR: POSITIVE — AB

## 2021-10-23 MED ORDER — ALBUTEROL SULFATE HFA 108 (90 BASE) MCG/ACT IN AERS: 2.0000 | INHALATION_SPRAY | Freq: Four times a day (QID) | RESPIRATORY_TRACT | 2 refills | Status: AC | PRN

## 2021-10-23 MED ORDER — KETOROLAC TROMETHAMINE 30 MG/ML IJ SOLN
30.0000 mg | Freq: Once | INTRAMUSCULAR | Status: AC
  Administered 2021-10-23: 30 mg via INTRAMUSCULAR
  Filled 2021-10-23: qty 1

## 2021-10-23 MED ORDER — AMOXICILLIN 500 MG PO CAPS: 500.0000 mg | ORAL_CAPSULE | Freq: Three times a day (TID) | ORAL | 0 refills | Status: AC

## 2021-10-23 NOTE — ED Triage Notes (Signed)
Pt c/o cough, congestion, and muscle spasms

## 2021-10-23 NOTE — ED Provider Notes (Signed)
[facility] Provider Note    Event Date/Time   First MD Initiated Contact with Patient 10/23/21 1118     (approximate)   History   Cough   HPI  Tara Harvey is a 56 y.o. female previously healthy female who presents to the ER for for 5 days of nasal congestion cough sore throat muscle aches.  Has been around her grandbaby has been sick with similar symptoms.  Not been on any recent antibiotics does have a history of bronchitis.  No history of heart troubles.  No history of PE or DVT.      Physical Exam   Triage Vital Signs: ED Triage Vitals  Enc Vitals Group     BP 10/23/21 0902 (!) 115/53     Pulse Rate 10/23/21 0902 80     Resp 10/23/21 0902 18     Temp 10/23/21 0902 98.3 F (36.8 C)     Temp Source 10/23/21 0902 Oral     SpO2 10/23/21 0902 94 %     Weight 10/23/21 0850 220 lb (99.8 kg)     Height 10/23/21 0850 5\' 5"  (1.651 m)     Head Circumference --      Peak Flow --      Pain Score 10/23/21 0850 6     Pain Loc --      Pain Edu? --      Excl. in [place]? --     Most recent vital signs: Vitals:   10/23/21 0902 10/23/21 1005  BP: (!) 115/53 (!) 120/48  Pulse: 80 74  Resp: 18 18  Temp: 98.3 F (36.8 C) 99.3 F (37.4 C)  SpO2: 94% 99%     Constitutional: Alert  Eyes: Conjunctivae are normal.  Head: Atraumatic. Nose: ++ congestion/rhinnorhea.  TTP of maxillary  Mouth/Throat: Mucous membranes are moist.   Neck: Painless ROM.  Cardiovascular:   Good peripheral circulation. Respiratory: Normal respiratory effort.  No retractions.  Gastrointestinal: Soft and nontender.  Musculoskeletal:  no deformity Neurologic:  MAE spontaneously. No gross focal neurologic deficits are appreciated.  Skin:  Skin is warm, dry and intact. No rash noted. Psychiatric: Mood and affect are normal. Speech and behavior are normal.    ED Results / Procedures / Treatments   Labs (all labs ordered are listed, but only abnormal results are  displayed) Labs Reviewed  RESP PANEL BY RT-PCR (FLU A&B, COVID) ARPGX2     EKG  Please see ED Course for my review and interpretation.  I personally reviewed all radiographic images ordered to evaluate for the above acute complaints and reviewed radiology reports and findings.  These findings were personally discussed with the patient.  Please see medical record for radiology report.    RADIOLOGY    PROCEDURES:  Critical Care performed: No  Procedures   MEDICATIONS ORDERED IN ED: Medications  ketorolac (TORADOL) 30 MG/ML injection 30 mg (30 mg Intramuscular Given 10/23/21 1142)     IMPRESSION / MDM / ASSESSMENT AND PLAN / ED COURSE  I reviewed the triage vital signs and the nursing notes.                              Differential diagnosis includes, but is not limited to, URI, COVID, pneumonia, pneumothorax, bronchitis, asthma, effusion, sinusitis, PE  Patient presented to the ER for evaluation of symptoms as described above.  She is having symptoms of URI.  No pleuritic chest pain  she is not tachycardic no hypoxia.  Low-grade temperature.  Primary complaint symptoms consistent with sinusitis.  Do hear occasional wheeze but otherwise good air movement.  Her abdominal exam is soft and benign she denies any urinary symptoms.   Clinical Course as of 10/23/21 1144  Sat Oct 23, 2021  1142 Chest x-ray on my review and interpretation does not show any evidence of pneumothorax or consolidation.  Her presentation is consistent with URI and sinusitis.  Given the duration of symptoms I think a course of antibiotics is clinically indicated.  Patient does not want to wait for results of viral PCR.  We will contact her with any abnormal results.  We discussed strict return precautions.  Patient agreeable to plan. [PR]    Clinical Course User Index [PR] Merlyn Lot, MD     FINAL CLINICAL IMPRESSION(S) / ED DIAGNOSES   Final diagnoses:  Chronic sinusitis, unspecified  location  Cough, unspecified type     Rx / DC Orders   ED Discharge Orders          Ordered    albuterol (VENTOLIN HFA) 108 (90 Base) MCG/ACT inhaler  Every 6 hours PRN        10/23/21 1143    amoxicillin (AMOXIL) 500 MG capsule  3 times daily        10/23/21 1143             Note:  This document was prepared using Dragon voice recognition software and may include unintentional dictation errors.    Merlyn Lot, MD 10/23/21 1144

## 2021-10-23 NOTE — ED Notes (Signed)
Pt to ED for nasal congestion since 2-3 days. Pt states has hx bronchitis. NAD, airway clear.

## 2022-02-02 ENCOUNTER — Other Ambulatory Visit: Payer: Self-pay | Admitting: Internal Medicine

## 2022-02-02 DIAGNOSIS — Z1231 Encounter for screening mammogram for malignant neoplasm of breast: Secondary | ICD-10-CM

## 2022-03-21 ENCOUNTER — Emergency Department: Payer: BC Managed Care – PPO

## 2022-03-21 ENCOUNTER — Emergency Department
Admission: EM | Admit: 2022-03-21 | Discharge: 2022-03-21 | Disposition: A | Discharge status: Home / Self Care | Payer: BC Managed Care – PPO | Attending: Emergency Medicine | Admitting: Emergency Medicine

## 2022-03-21 ENCOUNTER — Other Ambulatory Visit: Payer: Self-pay

## 2022-03-21 ENCOUNTER — Encounter: Payer: Self-pay | Admitting: Emergency Medicine

## 2022-03-21 DIAGNOSIS — Y990 Civilian activity done for income or pay: Secondary | ICD-10-CM | POA: Insufficient documentation

## 2022-03-21 DIAGNOSIS — X500XXA Overexertion from strenuous movement or load, initial encounter: Secondary | ICD-10-CM | POA: Diagnosis not present

## 2022-03-21 DIAGNOSIS — M62830 Muscle spasm of back: Secondary | ICD-10-CM

## 2022-03-21 DIAGNOSIS — M549 Dorsalgia, unspecified: Secondary | ICD-10-CM | POA: Diagnosis present

## 2022-03-21 DIAGNOSIS — T148XXA Other injury of unspecified body region, initial encounter: Secondary | ICD-10-CM

## 2022-03-21 MED ORDER — LIDOCAINE 5 % EX PTCH: 1.0000 | MEDICATED_PATCH | Freq: Two times a day (BID) | CUTANEOUS | 0 refills | Status: AC | PRN

## 2022-03-21 MED ORDER — NAPROXEN 500 MG PO TABS: 500.0000 mg | ORAL_TABLET | Freq: Two times a day (BID) | ORAL | 0 refills | Status: AC

## 2022-03-21 NOTE — ED Provider Notes (Signed)
[facility] Emergency Department Provider Note     Event Date/Time   First MD Initiated Contact with Patient 03/21/22 1510     (approximate)   History   Back Pain   HPI  Tara Harvey is a 57 y.o. female patient with a noncontributory medical history, presents to the ED for evaluation of ongoing back pain.  Patient reports pain between the shoulder blades with onset from work-related incident on Friday.  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  She reports reproducible pain to the mid back without referral.  No reports of any frank chest pain, shortness of breath, or distal paresthesias.     Physical Exam   Triage Vital Signs: ED Triage Vitals  Enc Vitals Group     BP 03/21/22 1425 121/65     Pulse Rate 03/21/22 1425 91     Resp 03/21/22 1425 18     Temp 03/21/22 1425 98.8 F (37.1 C)     Temp Source 03/21/22 1425 Oral     SpO2 03/21/22 1425 94 %     Weight --      Height --      Head Circumference --      Peak Flow --      Pain Score 03/21/22 1426 7     Pain Loc --      Pain Edu? --      Excl. in [place]? --     Most recent vital signs: Vitals:   03/21/22 1425  BP: 121/65  Pulse: 91  Resp: 18  Temp: 98.8 F (37.1 C)  SpO2: 94%    General Awake, no distress. NAD CV:  Good peripheral perfusion.  RESP:  Normal effort.  ABD:  No distention.  MSK:  Active range of motion of the upper extremities.  Normal spinal alignment without midline tenderness, spasm, vomiting, or step-off.  Patient with reducible pain with deep palpation to the inferior left scapulothoracic border.  Consistent with the patient's area of concern. SKIN:  No rash or skin changes noted    ED Results / Procedures / Treatments   Labs (all labs ordered are listed, but only abnormal results are displayed) Labs Reviewed - No data to display   EKG    RADIOLOGY  I personally viewed and evaluated these images as part of my  medical decision making, as well as reviewing the written report by the radiologist.  ED Provider Interpretation: no acute findings  DG Ribs Unilateral W/Chest Left  Result Date: 03/21/2022 CLINICAL DATA:  Left-sided rib pain after lifting at work EXAM: LEFT RIBS AND CHEST - 3+ VIEW COMPARISON:  None Available. FINDINGS: No displaced fracture or other bone lesions are seen involving the ribs. There is no evidence of pneumothorax or pleural effusion. Both lungs are clear. Heart size and mediastinal contours are within normal limits. IMPRESSION: No displaced fracture or other radiographic abnormality to explain left rib pain. Electronically Signed   By: Delanna Ahmadi M.D.   On: 03/21/2022 16:03     PROCEDURES:  Critical Care performed: No  Procedures   MEDICATIONS ORDERED IN ED: Medications - No data to display   IMPRESSION / MDM / [place] / ED COURSE  I reviewed the triage vital signs and the nursing notes.                              Differential  diagnosis includes, but is not limited to, musculoskeletal strain, rib injury, chest wall contusion  Patient's presentation is most consistent with acute presentation with potential threat to life or bodily function.   Patient's diagnosis is consistent with mid back pain secondary to MSK etiology.  Patient with a reassuring exam and workup.  No radiologic evidence of any acute rib fracture or intrathoracic process, based on my interpretation.  Reproducible musculoskeletal pain on exam.  Patient will be discharged home with prescriptions for lidocaine patches and naproxen. Patient is to follow up with primary provider as needed or otherwise directed. Patient is given ED precautions to return to the ED for any worsening or new symptoms.     FINAL CLINICAL IMPRESSION(S) / ED DIAGNOSES   Final diagnoses:  Muscle spasm of back  Muscle strain     Rx / DC Orders   ED Discharge Orders          Ordered    lidocaine  (LIDODERM) 5 %  Every 12 hours PRN        03/21/22 1616    naproxen (NAPROSYN) 500 MG tablet  2 times daily with meals        03/21/22 1616             Note:  This document was prepared using Dragon voice recognition software and may include unintentional dictation errors.    Melvenia Needles, PA-C 03/22/22 0015    Arta Silence, MD 03/23/22 1601

## 2022-03-21 NOTE — ED Triage Notes (Signed)
Patient to ED for back pain- in between shoulder blade. 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

## 2022-03-21 NOTE — Discharge Instructions (Addendum)
Your exam and XR are normal and reassuring. Your symptoms appear to be due to a muscle strain. Take the prescription meds as directed. Apply moist heat to reduce symptoms. Follow-up with Dr. Ruthann Cancer for ongoing symptoms.

## 2023-04-26 ENCOUNTER — Other Ambulatory Visit: Payer: Self-pay | Admitting: Internal Medicine

## 2023-04-26 DIAGNOSIS — Z1231 Encounter for screening mammogram for malignant neoplasm of breast: Secondary | ICD-10-CM
